# Patient Record
Sex: Female | Born: 1945
Health system: Southern US, Community
[De-identification: ages and names within clinical notes are randomized; demographics above are authoritative.]

## PROBLEM LIST (undated history)

## (undated) HISTORY — PX: ABDOMINAL HYSTERECTOMY: SHX81

---

## 1998-12-12 ENCOUNTER — Encounter: Admission: RE | Admit: 1998-12-12 | Discharge: 1998-12-12 | Payer: Self-pay

## 1999-02-15 ENCOUNTER — Other Ambulatory Visit: Admission: RE | Admit: 1999-02-15 | Discharge: 1999-02-15 | Payer: Self-pay | Admitting: Internal Medicine

## 1999-12-13 ENCOUNTER — Encounter: Admission: RE | Admit: 1999-12-13 | Discharge: 1999-12-13 | Payer: Self-pay | Admitting: *Deleted

## 2000-08-19 ENCOUNTER — Ambulatory Visit (HOSPITAL_COMMUNITY): Admission: RE | Admit: 2000-08-19 | Discharge: 2000-08-19 | Payer: Self-pay | Admitting: *Deleted

## 2000-12-13 ENCOUNTER — Encounter: Admission: RE | Admit: 2000-12-13 | Discharge: 2000-12-13 | Payer: Self-pay | Admitting: *Deleted

## 2001-12-17 ENCOUNTER — Encounter: Admission: RE | Admit: 2001-12-17 | Discharge: 2001-12-17 | Payer: Self-pay | Admitting: Internal Medicine

## 2002-12-23 ENCOUNTER — Encounter: Admission: RE | Admit: 2002-12-23 | Discharge: 2002-12-23 | Payer: Self-pay | Admitting: Internal Medicine

## 2003-12-27 ENCOUNTER — Ambulatory Visit (HOSPITAL_COMMUNITY): Admission: RE | Admit: 2003-12-27 | Discharge: 2003-12-27 | Payer: Self-pay | Admitting: Internal Medicine

## 2005-01-09 ENCOUNTER — Ambulatory Visit (HOSPITAL_COMMUNITY): Admission: RE | Admit: 2005-01-09 | Discharge: 2005-01-09 | Payer: Self-pay | Admitting: Internal Medicine

## 2005-11-23 ENCOUNTER — Ambulatory Visit (HOSPITAL_COMMUNITY): Admission: RE | Admit: 2005-11-23 | Discharge: 2005-11-23 | Payer: Self-pay | Admitting: *Deleted

## 2006-01-14 ENCOUNTER — Encounter: Admission: RE | Admit: 2006-01-14 | Discharge: 2006-01-14 | Payer: Self-pay | Admitting: Internal Medicine

## 2007-01-20 ENCOUNTER — Encounter: Admission: RE | Admit: 2007-01-20 | Discharge: 2007-01-20 | Payer: Self-pay | Admitting: Internal Medicine

## 2008-01-21 ENCOUNTER — Encounter: Admission: RE | Admit: 2008-01-21 | Discharge: 2008-01-21 | Payer: Self-pay | Admitting: Internal Medicine

## 2009-01-24 ENCOUNTER — Encounter: Admission: RE | Admit: 2009-01-24 | Discharge: 2009-01-24 | Payer: Self-pay | Admitting: Internal Medicine

## 2010-01-26 ENCOUNTER — Encounter: Admission: RE | Admit: 2010-01-26 | Discharge: 2010-01-26 | Payer: Self-pay | Admitting: Internal Medicine

## 2010-07-14 NOTE — Procedures (Signed)
[facility]  Patient:    Diane Colon, Diane Colon                         MRN: [id number] Proc. Date: 08/19/00 Attending:  Sabino Gasser, M.D.                           Procedure Report  PROCEDURE:  Colonoscopy.  INDICATIONS:  Colon polyps.  ANESTHESIA:  Demerol 60 mg, Versed 8 mg.  DESCRIPTION OF PROCEDURE:  With the patient mildly sedated in the left lateral decubitus position, the Olympus videoscopic colonoscope was inserted into the rectum and passed under direct vision to the cecum, identified by the ileocecal valve and appendiceal orifice, both of which were photographed. From this point, the colonoscope was slowly withdrawn taking circumferential views of the entire colonic mucosa, stopping in the rectum which appeared normal on direct view and retroflexed view.  The endoscope was straightened and withdrawn.  The patients vital signs and pulse oximeter remained stable. The patient tolerated the procedure well without apparent complications.  FINDINGS:  Negative colonoscopic examination to the cecum.  PLAN:  Repeat examination in approximately five years. DD:  08/19/00 TD:  08/19/00 Job: [id number]

## 2010-07-14 NOTE — Op Note (Signed)
Diane Colon, BRUNELLI                ACCOUNT NO.:  [id number]   MEDICAL RECORD NO.:  [id number]          PATIENT TYPE:  AMB   LOCATION:  ENDO                         FACILITY:  [facility]   PHYSICIAN:  Georgiana Spinner, M.D.    DATE OF BIRTH:  [birth year]   DATE OF PROCEDURE:  11/23/2005  DATE OF DISCHARGE:                                 OPERATIVE REPORT   PROCEDURE:  Colonoscopy.   INDICATIONS:  Rectal bleeding.   ANESTHESIA:  1. Fentanyl 100 mcg.  2. Versed 9 mg.   PROCEDURE:  With the patient mildly sedated in the left lateral decubitus  position, the Olympus videoscopic colonoscope was inserted into the rectum  and passed under direct vision to the cecum, identified by ileocecal valve  and appendiceal orifice, both of which were photographed. From this point,  the colonoscope was slowly withdrawn, taking circumferential views of the  colonic mucosa, stopping in the rectum which appeared normal on direct and  showed hemorrhoids on retroflexed view. The endoscope was straightened and  withdrawn.  The patient's vital signs and pulse oximeter remained stable.  The patient tolerated the procedure well, without apparent complications.   FINDINGS:  Internal hemorrhoids. Otherwise unremarkable colonoscopic  examination to the cecum.   PLAN:  Consider repeat examination in 5-10 years           ______________________________  Georgiana Spinner, M.D.     GMO/MEDQ  D:  11/23/2005  T:  11/25/2005  Job:  [id number]

## 2010-12-19 ENCOUNTER — Other Ambulatory Visit: Payer: Self-pay | Admitting: Internal Medicine

## 2010-12-19 DIAGNOSIS — Z1231 Encounter for screening mammogram for malignant neoplasm of breast: Secondary | ICD-10-CM

## 2011-01-29 ENCOUNTER — Ambulatory Visit
Admission: RE | Admit: 2011-01-29 | Discharge: 2011-01-29 | Disposition: A | Discharge status: Home / Self Care | Payer: Medicare Other | Source: Ambulatory Visit | Attending: Internal Medicine | Admitting: Internal Medicine

## 2011-01-29 DIAGNOSIS — Z1231 Encounter for screening mammogram for malignant neoplasm of breast: Secondary | ICD-10-CM

## 2011-12-21 ENCOUNTER — Other Ambulatory Visit: Payer: Self-pay | Admitting: Internal Medicine

## 2011-12-21 DIAGNOSIS — Z1231 Encounter for screening mammogram for malignant neoplasm of breast: Secondary | ICD-10-CM

## 2012-01-30 ENCOUNTER — Ambulatory Visit: Payer: Medicare Other

## 2012-02-05 ENCOUNTER — Ambulatory Visit
Admission: RE | Admit: 2012-02-05 | Discharge: 2012-02-05 | Disposition: A | Discharge status: Home / Self Care | Payer: Medicare Other | Source: Ambulatory Visit | Attending: Internal Medicine | Admitting: Internal Medicine

## 2012-02-05 DIAGNOSIS — Z1231 Encounter for screening mammogram for malignant neoplasm of breast: Secondary | ICD-10-CM

## 2012-12-29 ENCOUNTER — Other Ambulatory Visit: Payer: Self-pay

## 2012-12-29 DIAGNOSIS — Z1231 Encounter for screening mammogram for malignant neoplasm of breast: Secondary | ICD-10-CM

## 2013-02-11 ENCOUNTER — Ambulatory Visit
Admission: RE | Admit: 2013-02-11 | Discharge: 2013-02-11 | Disposition: A | Discharge status: Home / Self Care | Payer: 59 | Source: Ambulatory Visit

## 2013-02-11 DIAGNOSIS — Z1231 Encounter for screening mammogram for malignant neoplasm of breast: Secondary | ICD-10-CM

## 2013-02-18 ENCOUNTER — Other Ambulatory Visit: Payer: Self-pay | Admitting: Internal Medicine

## 2013-02-18 DIAGNOSIS — R928 Other abnormal and inconclusive findings on diagnostic imaging of breast: Secondary | ICD-10-CM

## 2013-03-02 ENCOUNTER — Ambulatory Visit
Admission: RE | Admit: 2013-03-02 | Discharge: 2013-03-02 | Disposition: A | Discharge status: Home / Self Care | Payer: Medicare Other | Source: Ambulatory Visit | Attending: Internal Medicine | Admitting: Internal Medicine

## 2013-03-02 DIAGNOSIS — R928 Other abnormal and inconclusive findings on diagnostic imaging of breast: Secondary | ICD-10-CM

## 2014-01-28 ENCOUNTER — Other Ambulatory Visit: Payer: Self-pay

## 2014-01-28 DIAGNOSIS — Z1231 Encounter for screening mammogram for malignant neoplasm of breast: Secondary | ICD-10-CM

## 2014-02-18 ENCOUNTER — Ambulatory Visit
Admission: RE | Admit: 2014-02-18 | Discharge: 2014-02-18 | Disposition: A | Discharge status: Home / Self Care | Payer: 59 | Source: Ambulatory Visit

## 2014-02-18 DIAGNOSIS — Z1231 Encounter for screening mammogram for malignant neoplasm of breast: Secondary | ICD-10-CM

## 2015-01-10 ENCOUNTER — Other Ambulatory Visit: Payer: Self-pay

## 2015-01-10 DIAGNOSIS — Z1231 Encounter for screening mammogram for malignant neoplasm of breast: Secondary | ICD-10-CM

## 2015-02-24 ENCOUNTER — Ambulatory Visit
Admission: RE | Admit: 2015-02-24 | Discharge: 2015-02-24 | Disposition: A | Discharge status: Home / Self Care | Payer: Medicare Other | Source: Ambulatory Visit

## 2015-02-24 DIAGNOSIS — Z1231 Encounter for screening mammogram for malignant neoplasm of breast: Secondary | ICD-10-CM

## 2016-01-16 ENCOUNTER — Other Ambulatory Visit: Payer: Self-pay | Admitting: Internal Medicine

## 2016-01-16 DIAGNOSIS — Z1231 Encounter for screening mammogram for malignant neoplasm of breast: Secondary | ICD-10-CM

## 2016-03-05 ENCOUNTER — Ambulatory Visit
Admission: RE | Admit: 2016-03-05 | Discharge: 2016-03-05 | Disposition: A | Discharge status: Home / Self Care | Payer: Medicare Other | Source: Ambulatory Visit | Attending: Internal Medicine | Admitting: Internal Medicine

## 2016-03-05 DIAGNOSIS — Z1231 Encounter for screening mammogram for malignant neoplasm of breast: Secondary | ICD-10-CM

## 2017-01-24 ENCOUNTER — Other Ambulatory Visit: Payer: Self-pay | Admitting: Internal Medicine

## 2017-01-24 DIAGNOSIS — Z1231 Encounter for screening mammogram for malignant neoplasm of breast: Secondary | ICD-10-CM

## 2017-03-06 ENCOUNTER — Ambulatory Visit
Admission: RE | Admit: 2017-03-06 | Discharge: 2017-03-06 | Disposition: A | Discharge status: Home / Self Care | Payer: Medicare Other | Source: Ambulatory Visit | Attending: Internal Medicine | Admitting: Internal Medicine

## 2017-03-06 DIAGNOSIS — Z1231 Encounter for screening mammogram for malignant neoplasm of breast: Secondary | ICD-10-CM

## 2018-01-21 ENCOUNTER — Other Ambulatory Visit: Payer: Self-pay | Admitting: Internal Medicine

## 2018-01-21 DIAGNOSIS — Z1231 Encounter for screening mammogram for malignant neoplasm of breast: Secondary | ICD-10-CM

## 2018-03-11 ENCOUNTER — Ambulatory Visit
Admission: RE | Admit: 2018-03-11 | Discharge: 2018-03-11 | Disposition: A | Discharge status: Home / Self Care | Payer: Medicare Other | Source: Ambulatory Visit | Attending: Internal Medicine | Admitting: Internal Medicine

## 2018-03-11 DIAGNOSIS — Z1231 Encounter for screening mammogram for malignant neoplasm of breast: Secondary | ICD-10-CM

## 2019-01-27 ENCOUNTER — Other Ambulatory Visit: Payer: Self-pay | Admitting: Internal Medicine

## 2019-01-27 DIAGNOSIS — Z1231 Encounter for screening mammogram for malignant neoplasm of breast: Secondary | ICD-10-CM

## 2019-03-19 ENCOUNTER — Other Ambulatory Visit: Payer: Self-pay

## 2019-03-19 ENCOUNTER — Ambulatory Visit
Admission: RE | Admit: 2019-03-19 | Discharge: 2019-03-19 | Disposition: A | Discharge status: Home / Self Care | Payer: TRICARE For Life (TFL) | Source: Ambulatory Visit | Attending: Internal Medicine | Admitting: Internal Medicine

## 2019-03-19 DIAGNOSIS — Z1231 Encounter for screening mammogram for malignant neoplasm of breast: Secondary | ICD-10-CM

## 2019-03-19 DIAGNOSIS — L249 Irritant contact dermatitis, unspecified cause: Secondary | ICD-10-CM | POA: Diagnosis not present

## 2019-03-19 DIAGNOSIS — L219 Seborrheic dermatitis, unspecified: Secondary | ICD-10-CM | POA: Diagnosis not present

## 2019-03-19 DIAGNOSIS — Z23 Encounter for immunization: Secondary | ICD-10-CM | POA: Diagnosis not present

## 2019-03-24 ENCOUNTER — Ambulatory Visit: Payer: Medicare Other

## 2019-04-02 ENCOUNTER — Ambulatory Visit: Payer: Medicare PPO | Attending: Internal Medicine

## 2019-04-02 DIAGNOSIS — H40013 Open angle with borderline findings, low risk, bilateral: Secondary | ICD-10-CM | POA: Diagnosis not present

## 2019-04-02 DIAGNOSIS — H02054 Trichiasis without entropian left upper eyelid: Secondary | ICD-10-CM | POA: Diagnosis not present

## 2019-04-02 DIAGNOSIS — Z23 Encounter for immunization: Secondary | ICD-10-CM | POA: Insufficient documentation

## 2019-04-02 NOTE — Progress Notes (Signed)
   Covid-19 Vaccination Clinic  Name:  Diane Colon    MRN: [id number] DOB: [birth year]  04/02/2019  [name] was observed post Covid-19 immunization for 15 minutes without incidence. She was provided with Vaccine Information Sheet and instruction to access the V-Safe system.   [name] was instructed to call 911 with any severe reactions post vaccine: Marland Kitchen Difficulty breathing  . Swelling of your face and throat  . A fast heartbeat  . A bad rash all over your body  . Dizziness and weakness    Immunizations Administered    Name Date Dose VIS Date Route   Pfizer COVID-19 Vaccine 04/02/2019 11:50 AM 0.3 mL 02/06/2019 Intramuscular   Manufacturer: [employer], [employer]   Lot: [id number]   NDC: [id number]

## 2019-04-10 ENCOUNTER — Ambulatory Visit: Payer: Medicare Other

## 2019-04-27 ENCOUNTER — Ambulatory Visit: Payer: Medicare PPO | Attending: Internal Medicine

## 2019-04-27 DIAGNOSIS — Z23 Encounter for immunization: Secondary | ICD-10-CM

## 2019-04-27 NOTE — Progress Notes (Signed)
   Covid-19 Vaccination Clinic  Name:  Diane Colon    MRN: [id number] DOB: 06-29-1945  04/27/2019  Diane Colon was observed post Covid-19 immunization for 15 minutes without incidence. She was provided with Vaccine Information Sheet and instruction to access the V-Safe system.   Diane Colon was instructed to call 911 with any severe reactions post vaccine: Marland Kitchen Difficulty breathing  . Swelling of your face and throat  . A fast heartbeat  . A bad rash all over your body  . Dizziness and weakness    Immunizations Administered    Name Date Dose VIS Date Route   Pfizer COVID-19 Vaccine 04/27/2019  3:04 PM 0.3 mL 02/06/2019 Intramuscular   Manufacturer: [employer], [employer]   Lot: [id number]   NDC: [address]-8

## 2019-06-18 DIAGNOSIS — E559 Vitamin D deficiency, unspecified: Secondary | ICD-10-CM | POA: Diagnosis not present

## 2019-06-18 DIAGNOSIS — Z0000 Encounter for general adult medical examination without abnormal findings: Secondary | ICD-10-CM | POA: Diagnosis not present

## 2019-06-18 DIAGNOSIS — R739 Hyperglycemia, unspecified: Secondary | ICD-10-CM | POA: Diagnosis not present

## 2019-06-25 DIAGNOSIS — Z0000 Encounter for general adult medical examination without abnormal findings: Secondary | ICD-10-CM | POA: Diagnosis not present

## 2019-06-25 DIAGNOSIS — R7302 Impaired glucose tolerance (oral): Secondary | ICD-10-CM | POA: Diagnosis not present

## 2019-06-25 DIAGNOSIS — E559 Vitamin D deficiency, unspecified: Secondary | ICD-10-CM | POA: Diagnosis not present

## 2019-06-25 DIAGNOSIS — M8580 Other specified disorders of bone density and structure, unspecified site: Secondary | ICD-10-CM | POA: Diagnosis not present

## 2019-09-01 DIAGNOSIS — L570 Actinic keratosis: Secondary | ICD-10-CM | POA: Diagnosis not present

## 2019-10-20 DIAGNOSIS — L089 Local infection of the skin and subcutaneous tissue, unspecified: Secondary | ICD-10-CM | POA: Diagnosis not present

## 2019-10-20 DIAGNOSIS — L0889 Other specified local infections of the skin and subcutaneous tissue: Secondary | ICD-10-CM | POA: Diagnosis not present

## 2019-10-27 DIAGNOSIS — I10 Essential (primary) hypertension: Secondary | ICD-10-CM | POA: Diagnosis not present

## 2019-11-26 DIAGNOSIS — R000 Tachycardia, unspecified: Secondary | ICD-10-CM | POA: Diagnosis not present

## 2019-11-26 DIAGNOSIS — I10 Essential (primary) hypertension: Secondary | ICD-10-CM | POA: Diagnosis not present

## 2019-12-24 DIAGNOSIS — I10 Essential (primary) hypertension: Secondary | ICD-10-CM | POA: Diagnosis not present

## 2019-12-24 DIAGNOSIS — R000 Tachycardia, unspecified: Secondary | ICD-10-CM | POA: Diagnosis not present

## 2020-01-14 DIAGNOSIS — L814 Other melanin hyperpigmentation: Secondary | ICD-10-CM | POA: Diagnosis not present

## 2020-01-14 DIAGNOSIS — Z85828 Personal history of other malignant neoplasm of skin: Secondary | ICD-10-CM | POA: Diagnosis not present

## 2020-01-14 DIAGNOSIS — D225 Melanocytic nevi of trunk: Secondary | ICD-10-CM | POA: Diagnosis not present

## 2020-01-14 DIAGNOSIS — L578 Other skin changes due to chronic exposure to nonionizing radiation: Secondary | ICD-10-CM | POA: Diagnosis not present

## 2020-01-14 DIAGNOSIS — L821 Other seborrheic keratosis: Secondary | ICD-10-CM | POA: Diagnosis not present

## 2020-01-27 DIAGNOSIS — Z79899 Other long term (current) drug therapy: Secondary | ICD-10-CM | POA: Diagnosis not present

## 2020-02-04 ENCOUNTER — Other Ambulatory Visit: Payer: Self-pay | Admitting: Internal Medicine

## 2020-02-04 DIAGNOSIS — Z1231 Encounter for screening mammogram for malignant neoplasm of breast: Secondary | ICD-10-CM

## 2020-02-05 DIAGNOSIS — I10 Essential (primary) hypertension: Secondary | ICD-10-CM | POA: Diagnosis not present

## 2020-02-05 DIAGNOSIS — R000 Tachycardia, unspecified: Secondary | ICD-10-CM | POA: Diagnosis not present

## 2020-03-01 DIAGNOSIS — H40013 Open angle with borderline findings, low risk, bilateral: Secondary | ICD-10-CM | POA: Diagnosis not present

## 2020-03-21 ENCOUNTER — Ambulatory Visit
Admission: RE | Admit: 2020-03-21 | Discharge: 2020-03-21 | Disposition: A | Discharge status: Home / Self Care | Payer: Medicare PPO | Source: Ambulatory Visit | Attending: Internal Medicine | Admitting: Internal Medicine

## 2020-03-21 ENCOUNTER — Other Ambulatory Visit: Payer: Self-pay

## 2020-03-21 DIAGNOSIS — Z1231 Encounter for screening mammogram for malignant neoplasm of breast: Secondary | ICD-10-CM

## 2020-03-22 ENCOUNTER — Other Ambulatory Visit: Payer: Self-pay | Admitting: Internal Medicine

## 2020-03-22 DIAGNOSIS — R928 Other abnormal and inconclusive findings on diagnostic imaging of breast: Secondary | ICD-10-CM

## 2020-04-05 ENCOUNTER — Other Ambulatory Visit: Payer: Self-pay

## 2020-04-05 ENCOUNTER — Ambulatory Visit
Admission: RE | Admit: 2020-04-05 | Discharge: 2020-04-05 | Disposition: A | Discharge status: Home / Self Care | Payer: Medicare PPO | Source: Ambulatory Visit | Attending: Internal Medicine | Admitting: Internal Medicine

## 2020-04-05 DIAGNOSIS — R922 Inconclusive mammogram: Secondary | ICD-10-CM | POA: Diagnosis not present

## 2020-04-05 DIAGNOSIS — R928 Other abnormal and inconclusive findings on diagnostic imaging of breast: Secondary | ICD-10-CM

## 2020-05-20 DIAGNOSIS — H01114 Allergic dermatitis of left upper eyelid: Secondary | ICD-10-CM | POA: Diagnosis not present

## 2020-05-20 DIAGNOSIS — H01115 Allergic dermatitis of left lower eyelid: Secondary | ICD-10-CM | POA: Diagnosis not present

## 2020-06-06 DIAGNOSIS — H40053 Ocular hypertension, bilateral: Secondary | ICD-10-CM | POA: Diagnosis not present

## 2020-06-06 DIAGNOSIS — H40013 Open angle with borderline findings, low risk, bilateral: Secondary | ICD-10-CM | POA: Diagnosis not present

## 2020-06-06 DIAGNOSIS — Z83511 Family history of glaucoma: Secondary | ICD-10-CM | POA: Diagnosis not present

## 2020-07-05 DIAGNOSIS — R7309 Other abnormal glucose: Secondary | ICD-10-CM | POA: Diagnosis not present

## 2020-07-05 DIAGNOSIS — Z0000 Encounter for general adult medical examination without abnormal findings: Secondary | ICD-10-CM | POA: Diagnosis not present

## 2020-07-05 DIAGNOSIS — I10 Essential (primary) hypertension: Secondary | ICD-10-CM | POA: Diagnosis not present

## 2020-07-05 DIAGNOSIS — E559 Vitamin D deficiency, unspecified: Secondary | ICD-10-CM | POA: Diagnosis not present

## 2020-07-05 DIAGNOSIS — R739 Hyperglycemia, unspecified: Secondary | ICD-10-CM | POA: Diagnosis not present

## 2020-07-11 DIAGNOSIS — R739 Hyperglycemia, unspecified: Secondary | ICD-10-CM | POA: Diagnosis not present

## 2020-07-11 DIAGNOSIS — Z0000 Encounter for general adult medical examination without abnormal findings: Secondary | ICD-10-CM | POA: Diagnosis not present

## 2020-07-11 DIAGNOSIS — E559 Vitamin D deficiency, unspecified: Secondary | ICD-10-CM | POA: Diagnosis not present

## 2020-07-11 DIAGNOSIS — I10 Essential (primary) hypertension: Secondary | ICD-10-CM | POA: Diagnosis not present

## 2020-07-11 DIAGNOSIS — R7309 Other abnormal glucose: Secondary | ICD-10-CM | POA: Diagnosis not present

## 2020-07-18 DIAGNOSIS — R7309 Other abnormal glucose: Secondary | ICD-10-CM | POA: Diagnosis not present

## 2020-07-18 DIAGNOSIS — E7800 Pure hypercholesterolemia, unspecified: Secondary | ICD-10-CM | POA: Diagnosis not present

## 2020-07-18 DIAGNOSIS — E559 Vitamin D deficiency, unspecified: Secondary | ICD-10-CM | POA: Diagnosis not present

## 2020-07-18 DIAGNOSIS — Z0000 Encounter for general adult medical examination without abnormal findings: Secondary | ICD-10-CM | POA: Diagnosis not present

## 2020-07-18 DIAGNOSIS — I10 Essential (primary) hypertension: Secondary | ICD-10-CM | POA: Diagnosis not present

## 2020-07-29 DIAGNOSIS — L249 Irritant contact dermatitis, unspecified cause: Secondary | ICD-10-CM | POA: Diagnosis not present

## 2020-09-14 DIAGNOSIS — D485 Neoplasm of uncertain behavior of skin: Secondary | ICD-10-CM | POA: Diagnosis not present

## 2020-09-14 DIAGNOSIS — L259 Unspecified contact dermatitis, unspecified cause: Secondary | ICD-10-CM | POA: Diagnosis not present

## 2020-11-02 NOTE — Progress Notes (Signed)
New Patient Note  RE: Diane Colon MRN: [id number] DOB: [birth year] Date of Office Visit: 11/03/2020  Consult requested by: Pearson Grippe, MD Primary care provider: Irena Reichmann, DO  Chief Complaint: Rash [place] dermatology about rash on face and now spread to body had biopsy that showed it was eczema)  History of Present Illness: I had the pleasure of seeing Diane Colon for initial evaluation at the [facility] on 11/03/2020. She is a 75 y.o. female, who is referred here by Irena Reichmann, DO for the evaluation of eczema.  Rash started about 5+ years ago but it's been getting worse lately. Mainly occurs on her face, neck and arms. Describes them as red and slightly itchy. Individual rashes lasts about 1 week  Associated symptoms include: none.  Frequency of episodes: depends.  Suspected triggers are unknown. Denies any fevers, chills, changes in medications, foods, personal care products or recent infections.   Currently using fragrance free/dye free items.  Switched to Vanicream which seems to be helping. Today her skin is mostly cleared up.   She has tried the following therapies: tacrolimus with good benefit.  She also tried desonide and some other creams with minimal benefit.  She may had systemic steroids in the past for this as well.  Previous work up includes: followed by dermatology who did skin biopsy showing eczema.  Previous history of rash/hives: no. Patient is up to date with the following cancer screening tests: physical exam, colonoscopy, mammogram.  Assessment and Plan: Lasheba is a 75 y.o. female with: Dermatitis Started to break out in rash 5 years ago but worsening lately.  Mainly on her face, neck and arms.  Tried desonide and tacrolimus with some benefit.  Skin biopsy showed acute spongiotic dermatitis. Concerned about allergic triggers. Today's skin testing showed: Positive to grass, trees, weed, cat.  Negative to common foods. Discussed with  patient that I'm not sure how much of the above allergens are contributing to her symptoms but distribution is more concerning for contact dermatitis.  Recommend patch testing next - this is done in our [place] office.  If there's something you use regularly on the face, bring that product in as well so we can patch test the product. See below for proper skin care. Use fragrance free and dye free products. No fabric softener or dryer sheets. Use Eucrisa (crisaborole) 2% ointment twice a day on mild rash flares on the face and body. This is a non-steroid ointment. Samples given.  May use tacrolimus (protopic) 0.1% ointment twice a day as needed for the rash. Don't use more than 7 days in a row.  Allergic contact dermatitis See assessment and plan as above.  Other allergic rhinitis Some rhinoconjunctivitis symptoms since stopped Benadryl for skin testing this week.  No prior allergy testing. 1 cat at home. Today's skin testing showed: Positive to grass, trees, weed, cat.  Start environmental control measures as below. Use over the counter antihistamines such as Zyrtec (cetirizine), Claritin (loratadine), Allegra (fexofenadine), or Xyzal (levocetirizine) daily as needed. May take twice a day during allergy flares. May switch antihistamines every few months.  Return for Patch testing.  Meds ordered this encounter  Medications   Crisaborole (EUCRISA) 2 % OINT    Sig: Apply 1 application topically 2 (two) times daily as needed (mild rash).    Dispense:  60 g    Refill:  5    Lab Orders  No laboratory test(s) ordered today    Other  allergy screening: Asthma: no Rhino conjunctivitis:  some symptoms this week since stopped benadryl. No prior allergy testing.  Food allergy: no Medication allergy: yes Hymenoptera allergy: no History of recurrent infections suggestive of immunodeficency: no  Diagnostics: Skin Testing: Environmental allergy panel and select foods. Positive to grass,  trees, weed, cat.  Negative to common foods. Results discussed with patient/family.  Airborne Adult Perc - 11/03/20 0904     Time Antigen Placed 0900    Allergen Manufacturer Waynette Buttery    Location Back    Number of Test 59    1. Control-Buffer 50% Glycerol Negative    2. Control-Histamine 1 mg/ml 2+    3. Albumin saline Negative    4. Bahia Negative    5. French Southern Territories Negative    6. Johnson Negative    7. Kentucky Blue Negative    8. Meadow Fescue 2+    9. Perennial Rye Negative    10. Sweet Vernal Negative    11. Timothy Negative    12. Cocklebur Negative    13. Burweed Marshelder Negative    14. Ragweed, short Negative    15. Ragweed, Giant Negative    16. Plantain,  English Negative    17. Lamb's Quarters Negative    18. Sheep Sorrell Negative    19. Rough Pigweed Negative    20. Marsh Elder, Rough Negative    21. Mugwort, Common Negative    22. Ash mix 4+    23. Birch mix 2+    24. Beech American 4+    25. Box, Elder --   +/-   26. Cedar, red Negative    27. Cottonwood, Guinea-Bissau --   +/-   28. Elm mix Negative    29. Hickory 2+    30. Maple mix Negative    31. Oak, Guinea-Bissau mix 3+    32. Pecan Pollen Negative    33. Pine mix Negative    34. Sycamore Eastern Negative    35. Walnut, Black Pollen Negative    36. Alternaria alternata Negative    37. Cladosporium Herbarum Negative    38. Aspergillus mix Negative    39. Penicillium mix Negative    40. Bipolaris sorokiniana (Helminthosporium) Negative    41. Drechslera spicifera (Curvularia) Negative    42. Mucor plumbeus Negative    43. Fusarium moniliforme Negative    44. Aureobasidium pullulans (pullulara) Negative    45. Rhizopus oryzae Negative    46. Botrytis cinera Negative    47. Epicoccum nigrum Negative    48. Phoma betae Negative    49. Candida Albicans Negative    50. Trichophyton mentagrophytes Negative    51. Mite, D Farinae  5,000 AU/ml Negative    52. Mite, D Pteronyssinus  5,000 AU/ml Negative    53.  Cat Hair 10,000 BAU/ml Negative    54.  Dog Epithelia Negative    55. Mixed Feathers Negative    56. Horse Epithelia Negative    57. Cockroach, German Negative    58. Mouse Negative    59. Tobacco Leaf Negative             Food Perc - 11/03/20 0904       Test Information   Time Antigen Placed 0900    Allergen Manufacturer Waynette Buttery    Location Back    Number of allergen test 10      Food   1. Peanut Negative    2. Soybean food Negative    3. Wheat, whole  Negative    4. Sesame Negative    5. Milk, cow Negative    6. Egg White, chicken Negative    7. Casein Negative    8. Shellfish mix Negative    9. Fish mix Negative    10. Cashew Negative             Intradermal - 11/03/20 0930     Time Antigen Placed 0930    Allergen Manufacturer Waynette ButteryGreer    Location Arm    Number of Test 13    Control Negative    French Southern TerritoriesBermuda Negative    Johnson Negative    Ragweed mix Negative    Weed mix 3+    Mold 1 Negative    Mold 2 Negative    Mold 3 Negative    Mold 4 Negative    Cat 3+    Dog Negative    Cockroach Negative    Mite mix Negative    Comments n             Past Medical History: Patient Active Problem List   Diagnosis Date Noted   Dermatitis 11/03/2020   Allergic contact dermatitis 11/03/2020   Other allergic rhinitis 11/03/2020    History reviewed. No pertinent past medical history. Past Surgical History: Past Surgical History:  Procedure Laterality Date   ABDOMINAL HYSTERECTOMY     Medication List:  Current Outpatient Medications  Medication Sig Dispense Refill   Ascorbic Acid (VITAMIN C) 500 MG CAPS Take 500 ng by mouth daily as needed.     Chromium Picolinate 400 MCG TABS Take 400 mcg by mouth See admin instructions.     Crisaborole (EUCRISA) 2 % OINT Apply 1 application topically 2 (two) times daily as needed (mild rash). 60 g 5   hydrochlorothiazide (HYDRODIURIL) 25 MG tablet Take 25 mg by mouth daily.     losartan (COZAAR) 100 MG tablet Take 1  tablet by mouth daily.     Magnesium 300 MG CAPS 1 capsule with a meal     tacrolimus (PROTOPIC) 0.1 % ointment 1 a small amount to affected area twice a day     No current facility-administered medications for this visit.   Allergies: Allergies  Allergen Reactions   Erythromycin Diarrhea, Other (See Comments) and Nausea Only   Latex Hives, Itching and Swelling   Penicillin G Hives   Sulfur Hives, Itching and Rash   Social History: Social History   Socioeconomic History   Marital status: Married    Spouse name: Not on file   Number of children: Not on file   Years of education: Not on file   Highest education level: Not on file  Occupational History   Not on file  Tobacco Use   Smoking status: Never    Passive exposure: Never   Smokeless tobacco: Never  Vaping Use   Vaping Use: Never used  Substance and Sexual Activity   Alcohol use: Yes   Drug use: Never   Sexual activity: Not on file  Other Topics Concern   Not on file  Social History Narrative   Not on file   Social Determinants of Health   Financial Resource Strain: Not on file  Food Insecurity: Not on file  Transportation Needs: Not on file  Physical Activity: Not on file  Stress: Not on file  Social Connections: Not on file   Lives in a 77 year old house. Smoking: denies Occupation: retired  Landscape architectnvironmental HistorySurveyor, minerals: Water Damage/mildew in the house: no  Carpet in the family room: no Carpet in the bedroom: no Heating: gas Cooling: central Pet: yes 1 cat x 12 yrs  Family History: Family History  Problem Relation Age of Onset   Breast cancer Mother    Breast cancer Paternal Aunt    Breast cancer Paternal Aunt    Allergic rhinitis Neg Hx    Angioedema Neg Hx    Asthma Neg Hx    Atopy Neg Hx    Eczema Neg Hx    Immunodeficiency Neg Hx    Urticaria Neg Hx    Review of Systems  Constitutional:  Negative for appetite change, chills, fever and unexpected weight change.  HENT:  Positive for  rhinorrhea. Negative for congestion.   Eyes:  Positive for itching.  Respiratory:  Negative for cough, chest tightness, shortness of breath and wheezing.   Cardiovascular:  Negative for chest pain.  Gastrointestinal:  Negative for abdominal pain.  Genitourinary:  Negative for difficulty urinating.  Skin:  Positive for rash.  Allergic/Immunologic: Positive for environmental allergies. Negative for food allergies.  Neurological:  Negative for headaches.   Objective: BP 122/76   Pulse 92   Temp 98.5 F (36.9 C) (Temporal)   Resp 16   Ht 5\' 2"  (1.575 m)   Wt 116 lb 6.4 oz (52.8 kg)   SpO2 98%   BMI 21.29 kg/m  Body mass index is 21.29 kg/m. Physical Exam Vitals and nursing note reviewed.  Constitutional:      Appearance: Normal appearance. She is well-developed.  HENT:     Head: Normocephalic and atraumatic.     Right Ear: Tympanic membrane and external ear normal.     Left Ear: Tympanic membrane and external ear normal.     Nose: Nose normal.     Mouth/Throat:     Mouth: Mucous membranes are moist.     Pharynx: Oropharynx is clear.  Eyes:     Conjunctiva/sclera: Conjunctivae normal.  Cardiovascular:     Rate and Rhythm: Normal rate and regular rhythm.     Heart sounds: Normal heart sounds. No murmur heard.   No friction rub. No gallop.  Pulmonary:     Effort: Pulmonary effort is normal.     Breath sounds: Normal breath sounds. No wheezing, rhonchi or rales.  Musculoskeletal:     Cervical back: Neck supple.  Skin:    General: Skin is warm.     Findings: Rash present.     Comments: Slightly erythematous patch on left neck area.  Neurological:     Mental Status: She is alert and oriented to person, place, and time.  Psychiatric:        Behavior: Behavior normal.  The plan was reviewed with the patient/family, and all questions/concerned were addressed.  It was my pleasure to see Diane Colon today and participate in her care. Please feel free to contact me with any  questions or concerns.  Sincerely,  Bonita Quin, DO Allergy & Immunology  [facility] office: [phone] [facility] office: [phone]

## 2020-11-03 ENCOUNTER — Encounter: Payer: Self-pay | Admitting: Allergy

## 2020-11-03 ENCOUNTER — Other Ambulatory Visit: Payer: Self-pay

## 2020-11-03 ENCOUNTER — Ambulatory Visit (INDEPENDENT_AMBULATORY_CARE_PROVIDER_SITE_OTHER): Payer: Medicare PPO | Admitting: Allergy

## 2020-11-03 VITALS — BP 122/76 | HR 92 | Temp 98.5°F | Resp 16 | Ht 62.0 in | Wt 116.4 lb

## 2020-11-03 DIAGNOSIS — L2089 Other atopic dermatitis: Secondary | ICD-10-CM

## 2020-11-03 DIAGNOSIS — L239 Allergic contact dermatitis, unspecified cause: Secondary | ICD-10-CM | POA: Insufficient documentation

## 2020-11-03 DIAGNOSIS — J3089 Other allergic rhinitis: Secondary | ICD-10-CM

## 2020-11-03 MED ORDER — EUCRISA 2 % EX OINT: 1.0000 "application " | TOPICAL_OINTMENT | Freq: Two times a day (BID) | CUTANEOUS | 5 refills | Status: DC | PRN

## 2020-11-03 MED ORDER — EUCRISA 2 % EX OINT: 1.0000 "application " | TOPICAL_OINTMENT | Freq: Two times a day (BID) | CUTANEOUS | 5 refills | Status: AC | PRN

## 2020-11-03 NOTE — Assessment & Plan Note (Signed)
.   See assessment and plan as above. 

## 2020-11-03 NOTE — Assessment & Plan Note (Signed)
Started to break out in rash 5 years ago but worsening lately.  Mainly on her face, neck and arms.  Tried desonide and tacrolimus with some benefit.  Skin biopsy showed acute spongiotic dermatitis. Concerned about allergic triggers.  Today's skin testing showed: Positive to grass, trees, weed, cat.  Negative to common foods.  Discussed with patient that I'm not sure how much of the above allergens are contributing to her symptoms but distribution is more concerning for contact dermatitis.  . Recommend patch testing next - this is done in our [place] office.  o If there's something you use regularly on the face, bring that product in as well so we can patch test the product. . See below for proper skin care. o Use fragrance free and dye free products. o No fabric softener or dryer sheets.  Use Eucrisa (crisaborole) 2% ointment twice a day on mild rash flares on the face and body. This is a non-steroid ointment. Samples given.   May use tacrolimus (protopic) 0.1% ointment twice a day as needed for the rash. Don't use more than 7 days in a row.

## 2020-11-03 NOTE — Assessment & Plan Note (Addendum)
Some rhinoconjunctivitis symptoms since stopped Benadryl for skin testing this week.  No prior allergy testing. 1 cat at home.  Today's skin testing showed: Positive to grass, trees, weed, cat.   Start environmental control measures as below.  Use over the counter antihistamines such as Zyrtec (cetirizine), Claritin (loratadine), Allegra (fexofenadine), or Xyzal (levocetirizine) daily as needed. May take twice a day during allergy flares. May switch antihistamines every few months.

## 2020-11-03 NOTE — Patient Instructions (Signed)
Today's skin testing showed: Positive to grass, trees, weed, cat.  Negative to common foods. Results given.   Rash: Distribution concerning for contact dermatitis. See below for proper skin care. Use fragrance free and dye free products. No fabric softener or dryer sheets.  Use Eucrisa (crisaborole) 2% ointment twice a day on mild rash flares on the face and body. This is a non-steroid ointment. Samples given.  If it burns, place the medication in the refrigerator.  Apply a thin layer of moisturizer and then apply the Eucrisa on top of it. May use tacrolimus (protopic) 0.1% ointment twice a day as needed for the rash. Don't use more than 7 days in a row.  Recommend patch testing next - this is done in our [place] office.  If there's something you use regularly on the face, bring that product in as well so we can patch test the product. Patches are best placed on Monday with return to office on Wednesday and Friday of same week for readings.  Patches once placed should not get wet.  You do not have to stop any medications for patch testing but should not be on oral prednisone. You can schedule a patch testing visit when convenient for your schedule.    Environmental allergies Start environmental control measures as below. Use over the counter antihistamines such as Zyrtec (cetirizine), Claritin (loratadine), Allegra (fexofenadine), or Xyzal (levocetirizine) daily as needed. May take twice a day during allergy flares. May switch antihistamines every few months.  Follow up for patch testing in the [place] office.   Skin care recommendations  Bath time: Always use lukewarm water. AVOID very hot or cold water. Keep bathing time to 5-10 minutes. Do NOT use bubble bath. Use a mild soap and use just enough to wash the dirty areas. Do NOT scrub skin vigorously.  After bathing, pat dry your skin with a towel. Do NOT rub or scrub the skin.  Moisturizers and prescriptions:  ALWAYS  apply moisturizers immediately after bathing (within 3 minutes). This helps to lock-in moisture. Use the moisturizer several times a day over the whole body. Good summer moisturizers include: Aveeno, CeraVe, Cetaphil. Good winter moisturizers include: Aquaphor, Vaseline, Cerave, Cetaphil, Eucerin, Vanicream. When using moisturizers along with medications, the moisturizer should be applied about one hour after applying the medication to prevent diluting effect of the medication or moisturize around where you applied the medications. When not using medications, the moisturizer can be continued twice daily as maintenance.  Laundry and clothing: Avoid laundry products with added color or perfumes. Use unscented hypo-allergenic laundry products such as Tide free, Cheer free & gentle, and All free and clear.  If the skin still seems dry or sensitive, you can try double-rinsing the clothes. Avoid tight or scratchy clothing such as wool. Do not use fabric softeners or dyer sheets.   Reducing Pollen Exposure Pollen seasons: trees (spring), grass (summer) and ragweed/weeds (fall). Keep windows closed in your home and car to lower pollen exposure.  Install air conditioning in the bedroom and throughout the house if possible.  Avoid going out in dry windy days - especially early morning. Pollen counts are highest between 5 - 10 AM and on dry, hot and windy days.  Save outside activities for late afternoon or after a heavy rain, when pollen levels are lower.  Avoid mowing of grass if you have grass pollen allergy. Be aware that pollen can also be transported indoors on people and pets.  Dry your clothes in an automatic  dryer rather than hanging them outside where they might collect pollen.  Rinse hair and eyes before bedtime.  Pet Allergen Avoidance: Contrary to popular opinion, there are no "hypoallergenic" breeds of dogs or cats. That is because people are not allergic to an animal's hair, but to an  allergen found in the animal's saliva, dander (dead skin flakes) or urine. Pet allergy symptoms typically occur within minutes. For some people, symptoms can build up and become most severe 8 to 12 hours after contact with the animal. People with severe allergies can experience reactions in public places if dander has been transported on the pet owners' clothing. Keeping an animal outdoors is only a partial solution, since homes with pets in the yard still have higher concentrations of animal allergens. Before getting a pet, ask your allergist to determine if you are allergic to animals. If your pet is already considered part of your family, try to minimize contact and keep the pet out of the bedroom and other rooms where you spend a great deal of time. As with dust mites, vacuum carpets often or replace carpet with a hardwood floor, tile or linoleum. High-efficiency particulate air (HEPA) cleaners can reduce allergen levels over time. While dander and saliva are the source of cat and dog allergens, urine is the source of allergens from rabbits, hamsters, mice and [place] pigs; so ask a non-allergic family member to clean the animal's cage. If you have a pet allergy, talk to your allergist about the potential for allergy immunotherapy (allergy shots). This strategy can often provide long-term relief.

## 2020-11-07 ENCOUNTER — Telehealth: Payer: Self-pay | Admitting: Allergy

## 2020-11-07 NOTE — Telephone Encounter (Signed)
[employer] [facility] Patient Assistance called to check on a prior authorization for [place].  Phone #: [phone] Fax #: [phone]

## 2020-11-08 NOTE — Telephone Encounter (Signed)
Submitted prior authorization for Eucrisa  Ref# [id number]  Should receive a fax within 72 hours

## 2020-11-14 ENCOUNTER — Encounter: Payer: Self-pay | Admitting: Allergy

## 2020-11-14 ENCOUNTER — Other Ambulatory Visit: Payer: Self-pay

## 2020-11-14 ENCOUNTER — Ambulatory Visit (INDEPENDENT_AMBULATORY_CARE_PROVIDER_SITE_OTHER): Payer: Medicare PPO | Admitting: Allergy

## 2020-11-14 VITALS — BP 124/74 | HR 90 | Temp 98.2°F | Resp 18 | Ht 62.0 in | Wt 118.0 lb

## 2020-11-14 DIAGNOSIS — H1010 Acute atopic conjunctivitis, unspecified eye: Secondary | ICD-10-CM | POA: Insufficient documentation

## 2020-11-14 DIAGNOSIS — L239 Allergic contact dermatitis, unspecified cause: Secondary | ICD-10-CM

## 2020-11-14 DIAGNOSIS — J302 Other seasonal allergic rhinitis: Secondary | ICD-10-CM | POA: Insufficient documentation

## 2020-11-14 NOTE — Progress Notes (Signed)
   Follow Up Note  RE: Diane Colon MRN: [id number] DOB: [birth year] Date of Office Visit: 11/14/2020  Referring provider: Irena Reichmann, DO Primary care provider: Irena Reichmann, DO  History of Present Illness: I had the pleasure of seeing Diane Colon for a follow up visit at the [facility] on 11/14/2020. She is a 75 y.o. female, who is being followed for dermatitis and allergic rhino conjunctivitis. Today she is here for patch test placement, given suspected history of contact dermatitis.   Dermatitis Still having some rash on the and neck but it did not flare as before.  Just got [place] approved. Currently using Eucrisa only as needed. Not used tacrolimus since the last visit.  Other creams she tried: Desonide 0.05% Ketoconazole 2% Triamcinolone 0.1% Mupirocin 2% Tacrolimus 0.1%  Brought in some products and eye drops she has been using to be patch tested for.    #1: La Roche wash #2: La Roche Eczema cream #3: La Roche moisturizer  #4: Refresh contacts #5: Refresh gel drops #6: Systane ultra #7: Zaditor #8: Contact lens  Diagnostics: TRUE Test patches and the above 8 items were placed.   Assessment and Plan: Diane Colon is a 75 y.o. female with: Allergic contact dermatitis Past history - Started to break out in rash 5 years ago but worsening lately.  Mainly on her face, neck and arms.  Tried desonide and tacrolimus with some benefit.  Tried: ketoconazole 2%, triamcinolone 0.1%, mupirocin 2%. Skin biopsy showed acute spongiotic dermatitis. 2022 skin testing showed: Positive to grass, trees, weed, cat.  Negative to common foods. Interim history - not as bad as before, brought items to be patch tested for.  Patches placed today - TRUE and 8 additional items provided by the patient. #1: La Roche wash #2: La Roche Eczema cream #3: La Roche moisturizer  #4: Refresh contacts #5: Refresh gel drops #6: Systane ultra #7: Zaditor #8: Contact lens Continue  proper skin care. Use fragrance free and dye free products. No fabric softener or dryer sheets. Use Eucrisa (crisaborole) 2% ointment twice a day on mild rash flares on the face and body. This is a non-steroid ointment.   Seasonal and perennial allergic rhinoconjunctivitis Past history - Some rhinoconjunctivitis symptoms since stopped Benadryl for skin testing this week.  1 cat at home. 2022 skin testing showed: Positive to grass, trees, weed, cat.  Continue environmental control measures as below. Use over the counter antihistamines such as Zyrtec (cetirizine), Claritin (loratadine), Allegra (fexofenadine), or Xyzal (levocetirizine) daily as needed. May take twice a day during allergy flares. May switch antihistamines every few months.  The patient was instructed regarding proper care of the patches for the next 48 hours. Do not get patches wet - avoid showering until the next visit. Do not engage in vigorous physical activity. Patient will follow up in 48 hours and 96 hours for patch readings.  It was my pleasure to see Diane Colon today and participate in her care. Please feel free to contact me with any questions or concerns.  Sincerely,  Wyline Mood, DO Allergy & Immunology  [facility] office: [phone] [facility] office: [phone] [place] office: [phone]

## 2020-11-14 NOTE — Patient Instructions (Addendum)
Rash: Patches placed today. Continue proper skin care. Use fragrance free and dye free products. No fabric softener or dryer sheets. Use Eucrisa (crisaborole) 2% ointment twice a day on mild rash flares on the face and body. This is a non-steroid ointment. Samples given.  If it burns, place the medication in the refrigerator.  Apply a thin layer of moisturizer and then apply the Eucrisa on top of it.  Environmental allergies 2022 skin testing showed: Positive to grass, trees, weed, cat.  Continue environmental control measures as below. Use over the counter antihistamines such as Zyrtec (cetirizine), Claritin (loratadine), Allegra (fexofenadine), or Xyzal (levocetirizine) daily as needed. May take twice a day during allergy flares. May switch antihistamines every few months.  Follow up for patch reading on Wednesday and Friday.   Skin care recommendations  Bath time: Always use lukewarm water. AVOID very hot or cold water. Keep bathing time to 5-10 minutes. Do NOT use bubble bath. Use a mild soap and use just enough to wash the dirty areas. Do NOT scrub skin vigorously.  After bathing, pat dry your skin with a towel. Do NOT rub or scrub the skin.  Moisturizers and prescriptions:  ALWAYS apply moisturizers immediately after bathing (within 3 minutes). This helps to lock-in moisture. Use the moisturizer several times a day over the whole body. Good summer moisturizers include: Aveeno, CeraVe, Cetaphil. Good winter moisturizers include: Aquaphor, Vaseline, Cerave, Cetaphil, Eucerin, Vanicream. When using moisturizers along with medications, the moisturizer should be applied about one hour after applying the medication to prevent diluting effect of the medication or moisturize around where you applied the medications. When not using medications, the moisturizer can be continued twice daily as maintenance.  Laundry and clothing: Avoid laundry products with added color or perfumes. Use  unscented hypo-allergenic laundry products such as Tide free, Cheer free & gentle, and All free and clear.  If the skin still seems dry or sensitive, you can try double-rinsing the clothes. Avoid tight or scratchy clothing such as wool. Do not use fabric softeners or dyer sheets.   Reducing Pollen Exposure Pollen seasons: trees (spring), grass (summer) and ragweed/weeds (fall). Keep windows closed in your home and car to lower pollen exposure.  Install air conditioning in the bedroom and throughout the house if possible.  Avoid going out in dry windy days - especially early morning. Pollen counts are highest between 5 - 10 AM and on dry, hot and windy days.  Save outside activities for late afternoon or after a heavy rain, when pollen levels are lower.  Avoid mowing of grass if you have grass pollen allergy. Be aware that pollen can also be transported indoors on people and pets.  Dry your clothes in an automatic dryer rather than hanging them outside where they might collect pollen.  Rinse hair and eyes before bedtime.  Pet Allergen Avoidance: Contrary to popular opinion, there are no "hypoallergenic" breeds of dogs or cats. That is because people are not allergic to an animal's hair, but to an allergen found in the animal's saliva, dander (dead skin flakes) or urine. Pet allergy symptoms typically occur within minutes. For some people, symptoms can build up and become most severe 8 to 12 hours after contact with the animal. People with severe allergies can experience reactions in public places if dander has been transported on the pet owners' clothing. Keeping an animal outdoors is only a partial solution, since homes with pets in the yard still have higher concentrations of animal allergens. Before  getting a pet, ask your allergist to determine if you are allergic to animals. If your pet is already considered part of your family, try to minimize contact and keep the pet out of the bedroom and  other rooms where you spend a great deal of time. As with dust mites, vacuum carpets often or replace carpet with a hardwood floor, tile or linoleum. High-efficiency particulate air (HEPA) cleaners can reduce allergen levels over time. While dander and saliva are the source of cat and dog allergens, urine is the source of allergens from rabbits, hamsters, mice and [place] pigs; so ask a non-allergic family member to clean the animal's cage. If you have a pet allergy, talk to your allergist about the potential for allergy immunotherapy (allergy shots). This strategy can often provide long-term relief.

## 2020-11-14 NOTE — Assessment & Plan Note (Signed)
Past history - Some rhinoconjunctivitis symptoms since stopped Benadryl for skin testing this week.  1 cat at home. 2022 skin testing showed: Positive to grass, trees, weed, cat.   Continue environmental control measures as below.  Use over the counter antihistamines such as Zyrtec (cetirizine), Claritin (loratadine), Allegra (fexofenadine), or Xyzal (levocetirizine) daily as needed. May take twice a day during allergy flares. May switch antihistamines every few months.

## 2020-11-14 NOTE — Assessment & Plan Note (Signed)
Past history - Started to break out in rash 5 years ago but worsening lately.  Mainly on her face, neck and arms.  Tried desonide and tacrolimus with some benefit.  Tried: ketoconazole 2%, triamcinolone 0.1%, mupirocin 2%. Skin biopsy showed acute spongiotic dermatitis. 2022 skin testing showed: Positive to grass, trees, weed, cat.  Negative to common foods. Interim history - not as bad as before, brought items to be patch tested for.  . Patches placed today - TRUE and 8 additional items provided by the patient.  #1: La Roche wash  #2: La Roche Eczema cream  #3: La Roche moisturizer   #4: Refresh contacts  #5: Refresh gel drops  #6: Systane ultra  #7: Zaditor  #8: Contact lens . Continue proper skin care. o Use fragrance free and dye free products. o No fabric softener or dryer sheets.  Use Eucrisa (crisaborole) 2% ointment twice a day on mild rash flares on the face and body. This is a non-steroid ointment.

## 2020-11-16 ENCOUNTER — Other Ambulatory Visit: Payer: Self-pay

## 2020-11-16 ENCOUNTER — Ambulatory Visit (INDEPENDENT_AMBULATORY_CARE_PROVIDER_SITE_OTHER): Payer: Medicare PPO | Admitting: Allergy

## 2020-11-16 ENCOUNTER — Encounter: Payer: TRICARE For Life (TFL) | Admitting: Allergy

## 2020-11-16 ENCOUNTER — Encounter: Payer: Self-pay | Admitting: Allergy

## 2020-11-16 VITALS — Temp 97.5°F

## 2020-11-16 DIAGNOSIS — L2389 Allergic contact dermatitis due to other agents: Secondary | ICD-10-CM

## 2020-11-16 NOTE — Progress Notes (Signed)
    Follow-up Note  RE: Diane Colon MRN: [id number] DOB: [birth year] Date of Office Visit: 11/16/2020  Primary care provider: Irena Reichmann, DO Referring provider: Irena Reichmann, DO   [name] returns to the office today for the initial patch test interpretation, given suspected history of contact dermatitis.    Diagnostics:  TRUE TEST 48 hour reading: 1+ nickel sulfate; 2+ Cl+Me-Isothiazolinone; 1+ gold sodium thiosulfate  Personal products 48 hour reading: 1+ Lipikar wash  Plan:  Allergic contact dermatitis The patient has been provided detailed information regarding the substances she is sensitive to, as well as products containing the substances.  Meticulous avoidance of these substances is recommended. If avoidance is not possible, the use of barrier creams or lotions is recommended. She will return in 2 days for final reading Discussed will provide with product safe list via email after final reading  Margo Aye, MD [facility]

## 2020-11-18 ENCOUNTER — Encounter: Payer: Self-pay | Admitting: Allergy

## 2020-11-18 ENCOUNTER — Ambulatory Visit (INDEPENDENT_AMBULATORY_CARE_PROVIDER_SITE_OTHER): Payer: Medicare PPO | Admitting: Allergy

## 2020-11-18 ENCOUNTER — Other Ambulatory Visit: Payer: Self-pay

## 2020-11-18 DIAGNOSIS — L2389 Allergic contact dermatitis due to other agents: Secondary | ICD-10-CM | POA: Diagnosis not present

## 2020-11-18 NOTE — Progress Notes (Signed)
    Follow-up Note  RE: Diane Colon MRN: [id number] DOB: 1945-12-27 Date of Office Visit: 11/18/2020  Primary care provider: Irena Reichmann, DO Referring provider: Irena Reichmann, DO   [name] returns to the office today for the final patch test interpretation, given suspected history of contact dermatitis.    Diagnostics:  TRUE TEST 96 hour reading: 1+ fragrance mix; 3+ Cl+Me-Isothiazolinone   Personal products 96 hour reading: 1+ to #1 double repair face moisturizer; 2+ to lipikar wash.  Plan:  Allergic contact dermatitis The patient has been provided detailed information regarding the substances she is sensitive to, as well as products containing the substances.  Meticulous avoidance of these substances is recommended. If avoidance is not possible, the use of barrier creams or lotions is recommended. The product safe list from the contact dermatitis Society has been emailed to [email or web address]  Margo Aye, MD [facility]

## 2020-12-16 ENCOUNTER — Encounter: Payer: Self-pay | Admitting: *Deleted

## 2021-01-17 DIAGNOSIS — E7800 Pure hypercholesterolemia, unspecified: Secondary | ICD-10-CM | POA: Diagnosis not present

## 2021-01-17 DIAGNOSIS — L814 Other melanin hyperpigmentation: Secondary | ICD-10-CM | POA: Diagnosis not present

## 2021-01-17 DIAGNOSIS — R7309 Other abnormal glucose: Secondary | ICD-10-CM | POA: Diagnosis not present

## 2021-01-17 DIAGNOSIS — D1801 Hemangioma of skin and subcutaneous tissue: Secondary | ICD-10-CM | POA: Diagnosis not present

## 2021-01-17 DIAGNOSIS — E559 Vitamin D deficiency, unspecified: Secondary | ICD-10-CM | POA: Diagnosis not present

## 2021-01-17 DIAGNOSIS — L821 Other seborrheic keratosis: Secondary | ICD-10-CM | POA: Diagnosis not present

## 2021-01-23 DIAGNOSIS — H40013 Open angle with borderline findings, low risk, bilateral: Secondary | ICD-10-CM | POA: Diagnosis not present

## 2021-01-24 DIAGNOSIS — R7309 Other abnormal glucose: Secondary | ICD-10-CM | POA: Diagnosis not present

## 2021-01-24 DIAGNOSIS — E559 Vitamin D deficiency, unspecified: Secondary | ICD-10-CM | POA: Diagnosis not present

## 2021-01-24 DIAGNOSIS — I10 Essential (primary) hypertension: Secondary | ICD-10-CM | POA: Diagnosis not present

## 2021-01-24 DIAGNOSIS — R7989 Other specified abnormal findings of blood chemistry: Secondary | ICD-10-CM | POA: Diagnosis not present

## 2021-01-24 DIAGNOSIS — E7800 Pure hypercholesterolemia, unspecified: Secondary | ICD-10-CM | POA: Diagnosis not present

## 2021-01-25 ENCOUNTER — Telehealth: Payer: Self-pay

## 2021-01-25 NOTE — Telephone Encounter (Signed)
[employer] [facility] called due to the patient's prior authorization expiring 02/24/21. The pharmacist started the process for a new prior authorization and will fax over the information to the [place] office.

## 2021-01-26 NOTE — Telephone Encounter (Signed)
PA has been submitted through CoverMyMeds for Eucrisa and is currently pending approval/denial.  

## 2021-01-26 NOTE — Telephone Encounter (Signed)
Eucrisa 2% ointment has been approved until 02/25/2022. Approval letter has been faxed to [facility].

## 2021-03-01 ENCOUNTER — Other Ambulatory Visit: Payer: Self-pay | Admitting: Family Medicine

## 2021-03-01 DIAGNOSIS — Z1231 Encounter for screening mammogram for malignant neoplasm of breast: Secondary | ICD-10-CM

## 2021-03-10 DIAGNOSIS — H18413 Arcus senilis, bilateral: Secondary | ICD-10-CM | POA: Diagnosis not present

## 2021-03-10 DIAGNOSIS — H25043 Posterior subcapsular polar age-related cataract, bilateral: Secondary | ICD-10-CM | POA: Diagnosis not present

## 2021-03-10 DIAGNOSIS — H25013 Cortical age-related cataract, bilateral: Secondary | ICD-10-CM | POA: Diagnosis not present

## 2021-03-10 DIAGNOSIS — H2511 Age-related nuclear cataract, right eye: Secondary | ICD-10-CM | POA: Diagnosis not present

## 2021-03-10 DIAGNOSIS — H2513 Age-related nuclear cataract, bilateral: Secondary | ICD-10-CM | POA: Diagnosis not present

## 2021-03-22 ENCOUNTER — Ambulatory Visit
Admission: RE | Admit: 2021-03-22 | Discharge: 2021-03-22 | Disposition: A | Discharge status: Home / Self Care | Payer: Medicare PPO | Source: Ambulatory Visit | Attending: Family Medicine | Admitting: Family Medicine

## 2021-03-22 DIAGNOSIS — Z1231 Encounter for screening mammogram for malignant neoplasm of breast: Secondary | ICD-10-CM

## 2021-03-24 ENCOUNTER — Other Ambulatory Visit: Payer: Self-pay | Admitting: Family Medicine

## 2021-03-24 DIAGNOSIS — R928 Other abnormal and inconclusive findings on diagnostic imaging of breast: Secondary | ICD-10-CM

## 2021-04-06 ENCOUNTER — Other Ambulatory Visit: Payer: Self-pay

## 2021-04-06 ENCOUNTER — Ambulatory Visit
Admission: RE | Admit: 2021-04-06 | Discharge: 2021-04-06 | Disposition: A | Discharge status: Home / Self Care | Payer: Medicare PPO | Source: Ambulatory Visit | Attending: Family Medicine | Admitting: Family Medicine

## 2021-04-06 ENCOUNTER — Other Ambulatory Visit: Payer: Self-pay | Admitting: Family Medicine

## 2021-04-06 DIAGNOSIS — R922 Inconclusive mammogram: Secondary | ICD-10-CM | POA: Diagnosis not present

## 2021-04-06 DIAGNOSIS — R928 Other abnormal and inconclusive findings on diagnostic imaging of breast: Secondary | ICD-10-CM

## 2021-04-06 DIAGNOSIS — R921 Mammographic calcification found on diagnostic imaging of breast: Secondary | ICD-10-CM

## 2021-05-05 DIAGNOSIS — I10 Essential (primary) hypertension: Secondary | ICD-10-CM | POA: Diagnosis not present

## 2021-05-15 DIAGNOSIS — H52201 Unspecified astigmatism, right eye: Secondary | ICD-10-CM | POA: Diagnosis not present

## 2021-05-15 DIAGNOSIS — H25811 Combined forms of age-related cataract, right eye: Secondary | ICD-10-CM | POA: Diagnosis not present

## 2021-05-15 DIAGNOSIS — H2511 Age-related nuclear cataract, right eye: Secondary | ICD-10-CM | POA: Diagnosis not present

## 2021-05-16 DIAGNOSIS — H2512 Age-related nuclear cataract, left eye: Secondary | ICD-10-CM | POA: Diagnosis not present

## 2021-05-29 DIAGNOSIS — H25812 Combined forms of age-related cataract, left eye: Secondary | ICD-10-CM | POA: Diagnosis not present

## 2021-05-29 DIAGNOSIS — H52202 Unspecified astigmatism, left eye: Secondary | ICD-10-CM | POA: Diagnosis not present

## 2021-05-29 DIAGNOSIS — H2512 Age-related nuclear cataract, left eye: Secondary | ICD-10-CM | POA: Diagnosis not present

## 2021-07-19 DIAGNOSIS — E7800 Pure hypercholesterolemia, unspecified: Secondary | ICD-10-CM | POA: Diagnosis not present

## 2021-07-19 DIAGNOSIS — R739 Hyperglycemia, unspecified: Secondary | ICD-10-CM | POA: Diagnosis not present

## 2021-07-19 DIAGNOSIS — R7309 Other abnormal glucose: Secondary | ICD-10-CM | POA: Diagnosis not present

## 2021-07-19 DIAGNOSIS — R7989 Other specified abnormal findings of blood chemistry: Secondary | ICD-10-CM | POA: Diagnosis not present

## 2021-07-19 DIAGNOSIS — E559 Vitamin D deficiency, unspecified: Secondary | ICD-10-CM | POA: Diagnosis not present

## 2021-07-19 DIAGNOSIS — I10 Essential (primary) hypertension: Secondary | ICD-10-CM | POA: Diagnosis not present

## 2021-07-26 DIAGNOSIS — Z0000 Encounter for general adult medical examination without abnormal findings: Secondary | ICD-10-CM | POA: Diagnosis not present

## 2021-07-26 DIAGNOSIS — E7800 Pure hypercholesterolemia, unspecified: Secondary | ICD-10-CM | POA: Diagnosis not present

## 2021-07-26 DIAGNOSIS — M778 Other enthesopathies, not elsewhere classified: Secondary | ICD-10-CM | POA: Diagnosis not present

## 2021-07-26 DIAGNOSIS — M8589 Other specified disorders of bone density and structure, multiple sites: Secondary | ICD-10-CM | POA: Diagnosis not present

## 2021-07-26 DIAGNOSIS — Z9109 Other allergy status, other than to drugs and biological substances: Secondary | ICD-10-CM | POA: Diagnosis not present

## 2021-07-26 DIAGNOSIS — I10 Essential (primary) hypertension: Secondary | ICD-10-CM | POA: Diagnosis not present

## 2021-07-26 DIAGNOSIS — R7309 Other abnormal glucose: Secondary | ICD-10-CM | POA: Diagnosis not present

## 2021-07-26 DIAGNOSIS — M8580 Other specified disorders of bone density and structure, unspecified site: Secondary | ICD-10-CM | POA: Diagnosis not present

## 2021-08-03 DIAGNOSIS — H40013 Open angle with borderline findings, low risk, bilateral: Secondary | ICD-10-CM | POA: Diagnosis not present

## 2021-08-10 DIAGNOSIS — L249 Irritant contact dermatitis, unspecified cause: Secondary | ICD-10-CM | POA: Diagnosis not present

## 2021-10-02 DIAGNOSIS — L249 Irritant contact dermatitis, unspecified cause: Secondary | ICD-10-CM | POA: Diagnosis not present

## 2021-10-02 DIAGNOSIS — L562 Photocontact dermatitis [berloque dermatitis]: Secondary | ICD-10-CM | POA: Diagnosis not present

## 2021-10-10 ENCOUNTER — Other Ambulatory Visit: Payer: Self-pay | Admitting: Family Medicine

## 2021-10-10 ENCOUNTER — Ambulatory Visit
Admission: RE | Admit: 2021-10-10 | Discharge: 2021-10-10 | Disposition: A | Discharge status: Home / Self Care | Payer: Medicare PPO | Source: Ambulatory Visit | Attending: Family Medicine | Admitting: Family Medicine

## 2021-10-10 DIAGNOSIS — R921 Mammographic calcification found on diagnostic imaging of breast: Secondary | ICD-10-CM

## 2022-01-29 DIAGNOSIS — I10 Essential (primary) hypertension: Secondary | ICD-10-CM | POA: Diagnosis not present

## 2022-01-29 DIAGNOSIS — M8580 Other specified disorders of bone density and structure, unspecified site: Secondary | ICD-10-CM | POA: Diagnosis not present

## 2022-01-29 DIAGNOSIS — R7309 Other abnormal glucose: Secondary | ICD-10-CM | POA: Diagnosis not present

## 2022-01-30 DIAGNOSIS — L821 Other seborrheic keratosis: Secondary | ICD-10-CM | POA: Diagnosis not present

## 2022-01-30 DIAGNOSIS — Z08 Encounter for follow-up examination after completed treatment for malignant neoplasm: Secondary | ICD-10-CM | POA: Diagnosis not present

## 2022-01-30 DIAGNOSIS — Z85828 Personal history of other malignant neoplasm of skin: Secondary | ICD-10-CM | POA: Diagnosis not present

## 2022-01-30 DIAGNOSIS — L814 Other melanin hyperpigmentation: Secondary | ICD-10-CM | POA: Diagnosis not present

## 2022-01-30 DIAGNOSIS — D1801 Hemangioma of skin and subcutaneous tissue: Secondary | ICD-10-CM | POA: Diagnosis not present

## 2022-02-03 IMAGING — MG MM DIGITAL SCREENING BILAT W/ TOMO AND CAD
6 of 12 series · 6 of 36 positions shown · non-contrast
Comparison: Prior films

CLINICAL DATA: Screening.

EXAM:
DIGITAL SCREENING BILATERAL MAMMOGRAM WITH TOMO AND CAD

[L MLO synth-2D]
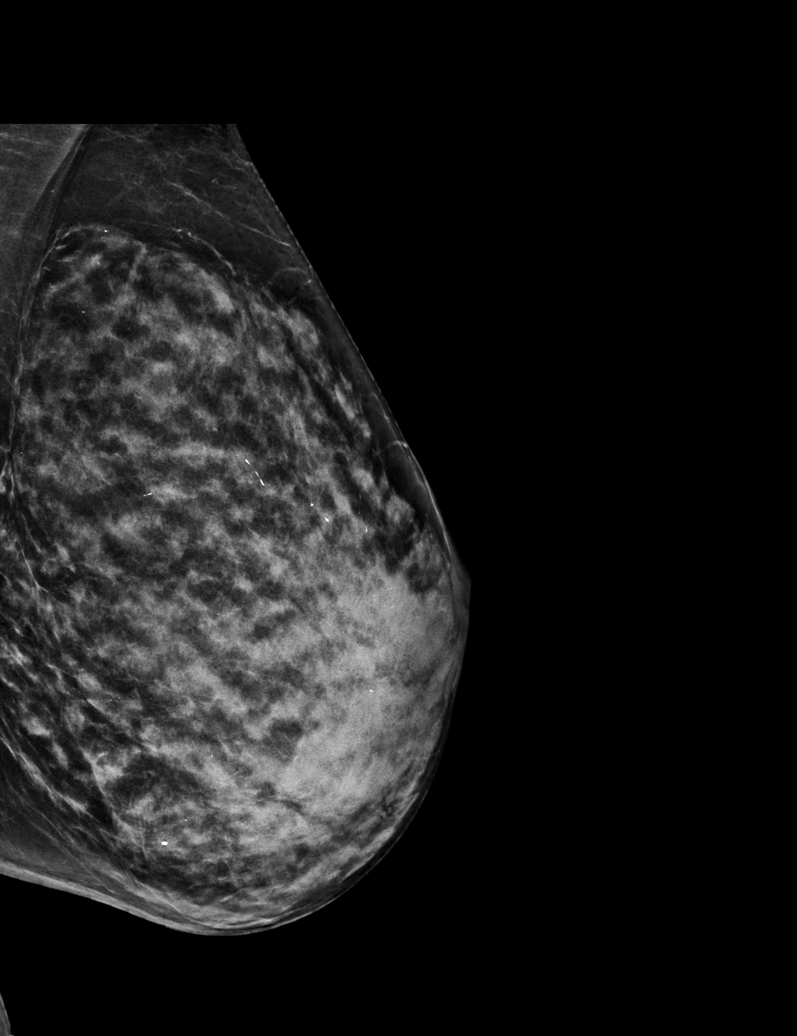

[R MLO synth-2D]
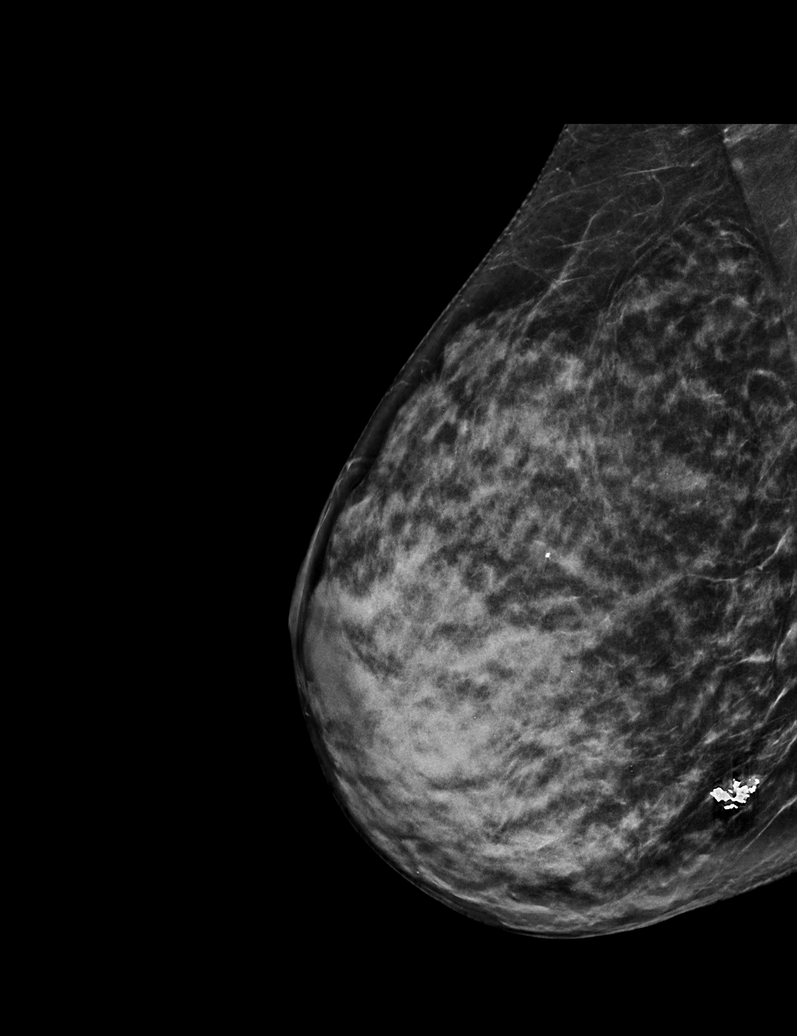

[L CC synth-2D (1 of 2)]
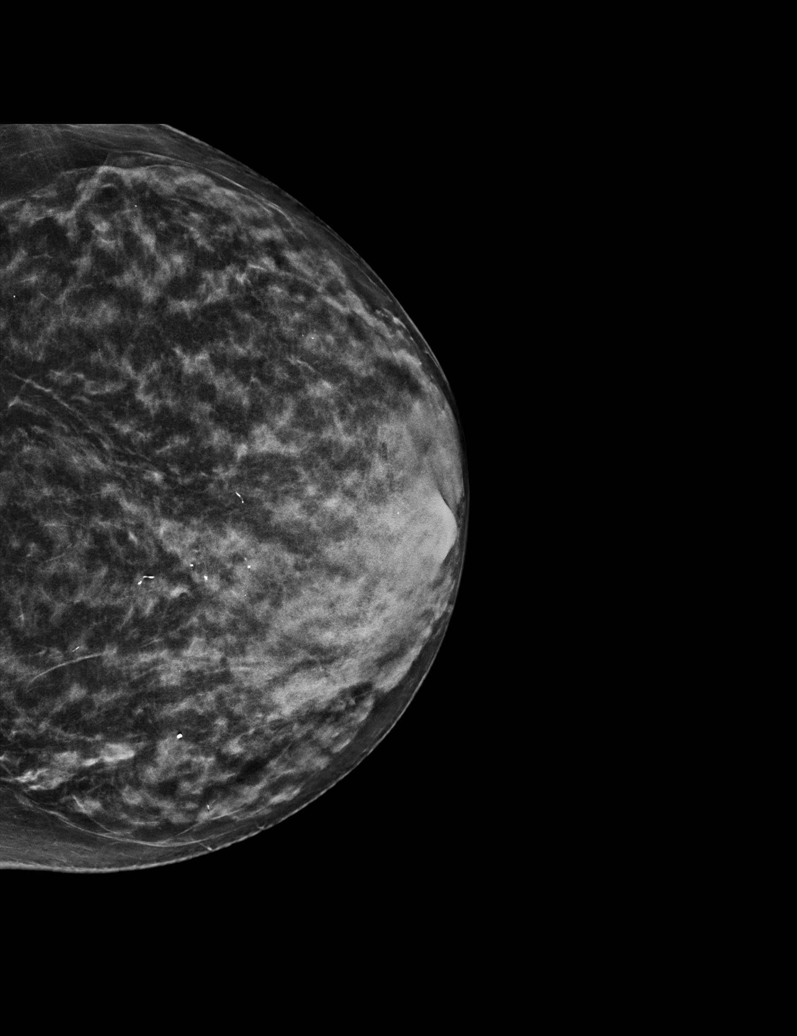

[L CC synth-2D (2 of 2)]
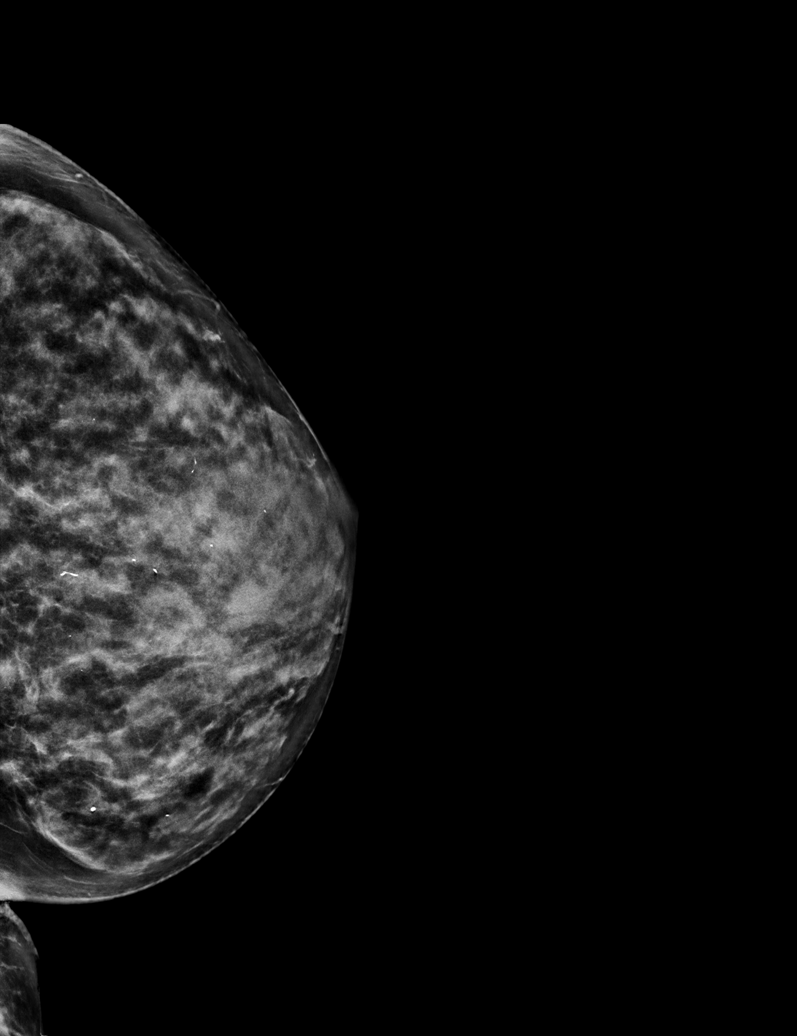

[R CC synth-2D (1 of 2)]
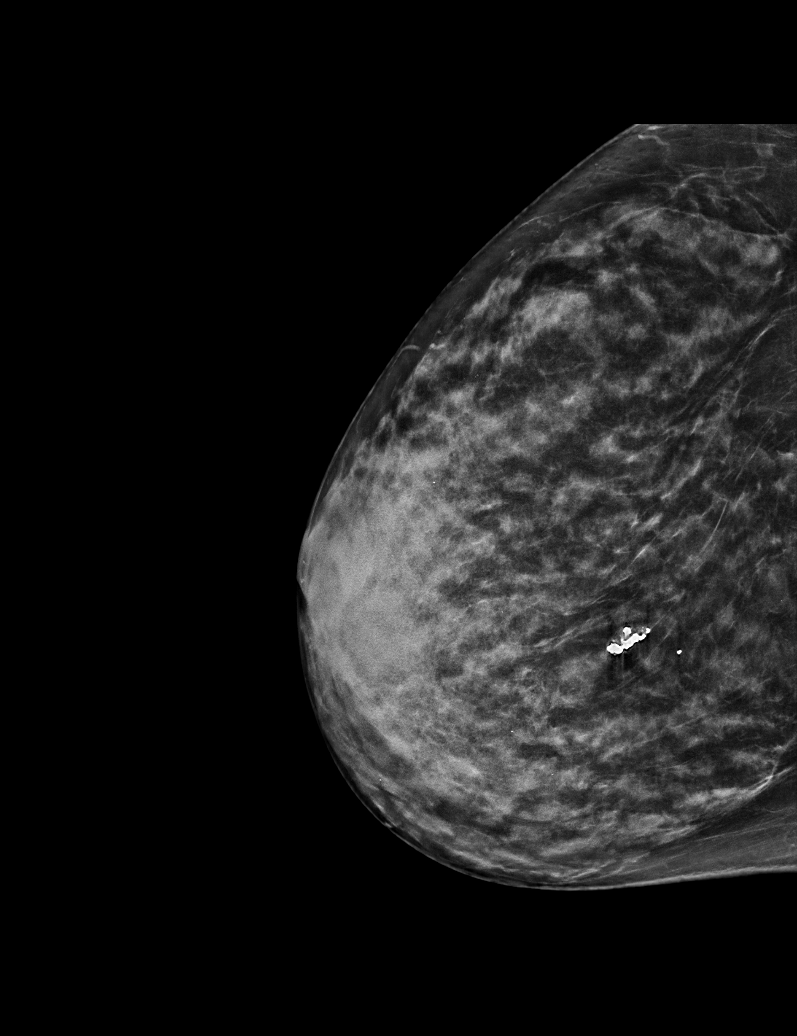

[R CC synth-2D (2 of 2)]
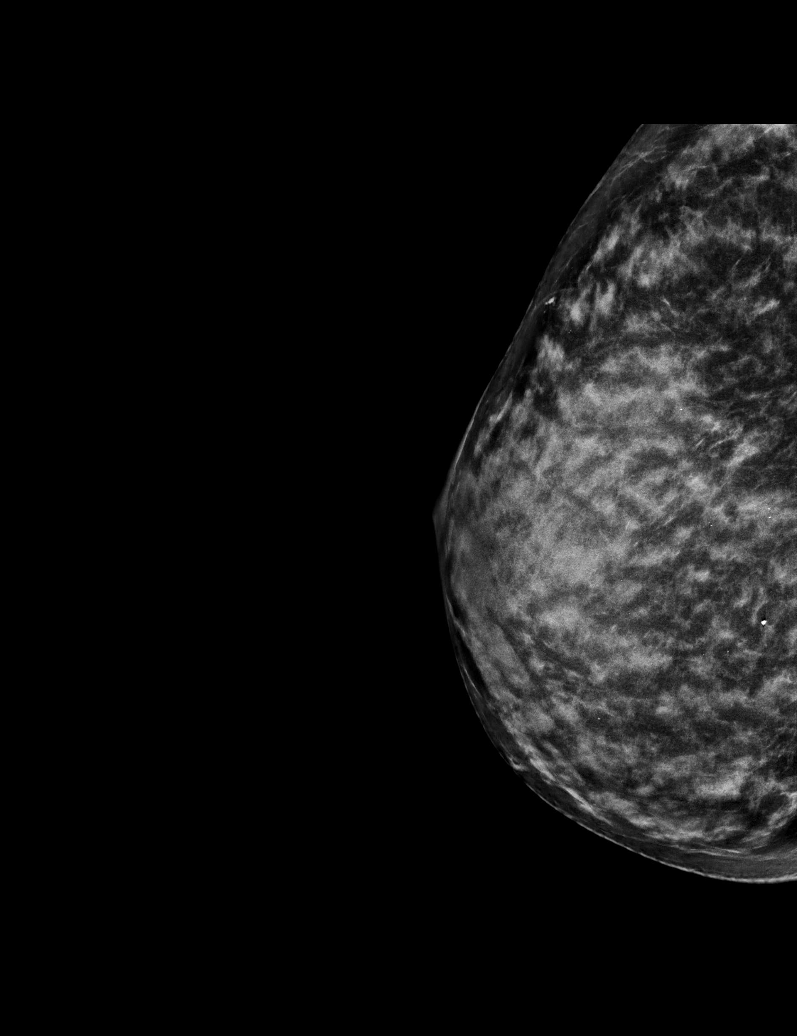

[6 of 36 positions shown; findings below may reference images not displayed]

ACR Breast Density Category d: The breast tissue is extremely dense,
which lowers the sensitivity of mammography.
FINDINGS: In the left breast, calcifications warrant further evaluation. In
the right breast, no findings suspicious for malignancy. The images
were evaluated with computer-aided detection.
IMPRESSION: Further evaluation is suggested for calcifications in the left
breast.

RECOMMENDATION:
Diagnostic mammogram of the left breast. (Code:[id number])

The patient will be contacted regarding the findings, and additional
imaging will be scheduled.

BI-RADS CATEGORY  0: Incomplete. Need additional imaging evaluation
and/or prior mammograms for comparison.

## 2022-02-08 DIAGNOSIS — H40013 Open angle with borderline findings, low risk, bilateral: Secondary | ICD-10-CM | POA: Diagnosis not present

## 2022-02-12 DIAGNOSIS — L259 Unspecified contact dermatitis, unspecified cause: Secondary | ICD-10-CM | POA: Diagnosis not present

## 2022-03-01 DIAGNOSIS — Z79899 Other long term (current) drug therapy: Secondary | ICD-10-CM | POA: Diagnosis not present

## 2022-03-01 DIAGNOSIS — R7989 Other specified abnormal findings of blood chemistry: Secondary | ICD-10-CM | POA: Diagnosis not present

## 2022-03-01 DIAGNOSIS — Z9109 Other allergy status, other than to drugs and biological substances: Secondary | ICD-10-CM | POA: Diagnosis not present

## 2022-03-01 DIAGNOSIS — R7309 Other abnormal glucose: Secondary | ICD-10-CM | POA: Diagnosis not present

## 2022-03-01 DIAGNOSIS — M8580 Other specified disorders of bone density and structure, unspecified site: Secondary | ICD-10-CM | POA: Diagnosis not present

## 2022-03-01 DIAGNOSIS — E559 Vitamin D deficiency, unspecified: Secondary | ICD-10-CM | POA: Diagnosis not present

## 2022-03-01 DIAGNOSIS — I10 Essential (primary) hypertension: Secondary | ICD-10-CM | POA: Diagnosis not present

## 2022-04-09 ENCOUNTER — Ambulatory Visit
Admission: RE | Admit: 2022-04-09 | Discharge: 2022-04-09 | Disposition: A | Discharge status: Home / Self Care | Payer: Medicare PPO | Source: Ambulatory Visit | Attending: Family Medicine | Admitting: Family Medicine

## 2022-04-09 DIAGNOSIS — R921 Mammographic calcification found on diagnostic imaging of breast: Secondary | ICD-10-CM

## 2022-04-24 DIAGNOSIS — J069 Acute upper respiratory infection, unspecified: Secondary | ICD-10-CM | POA: Diagnosis not present

## 2022-08-21 DIAGNOSIS — H40013 Open angle with borderline findings, low risk, bilateral: Secondary | ICD-10-CM | POA: Diagnosis not present

## 2022-09-05 DIAGNOSIS — E559 Vitamin D deficiency, unspecified: Secondary | ICD-10-CM | POA: Diagnosis not present

## 2022-09-05 DIAGNOSIS — Z79899 Other long term (current) drug therapy: Secondary | ICD-10-CM | POA: Diagnosis not present

## 2022-09-05 DIAGNOSIS — R7309 Other abnormal glucose: Secondary | ICD-10-CM | POA: Diagnosis not present

## 2022-09-05 DIAGNOSIS — Z9109 Other allergy status, other than to drugs and biological substances: Secondary | ICD-10-CM | POA: Diagnosis not present

## 2022-09-05 DIAGNOSIS — I10 Essential (primary) hypertension: Secondary | ICD-10-CM | POA: Diagnosis not present

## 2022-09-05 DIAGNOSIS — M8580 Other specified disorders of bone density and structure, unspecified site: Secondary | ICD-10-CM | POA: Diagnosis not present

## 2022-09-05 DIAGNOSIS — R7989 Other specified abnormal findings of blood chemistry: Secondary | ICD-10-CM | POA: Diagnosis not present

## 2022-09-12 DIAGNOSIS — Z79899 Other long term (current) drug therapy: Secondary | ICD-10-CM | POA: Diagnosis not present

## 2022-09-12 DIAGNOSIS — I10 Essential (primary) hypertension: Secondary | ICD-10-CM | POA: Diagnosis not present

## 2022-09-12 DIAGNOSIS — E7800 Pure hypercholesterolemia, unspecified: Secondary | ICD-10-CM | POA: Diagnosis not present

## 2022-09-12 DIAGNOSIS — R7303 Prediabetes: Secondary | ICD-10-CM | POA: Diagnosis not present

## 2022-09-12 DIAGNOSIS — Z9109 Other allergy status, other than to drugs and biological substances: Secondary | ICD-10-CM | POA: Diagnosis not present

## 2022-09-12 DIAGNOSIS — Z0000 Encounter for general adult medical examination without abnormal findings: Secondary | ICD-10-CM | POA: Diagnosis not present

## 2022-10-24 DIAGNOSIS — L820 Inflamed seborrheic keratosis: Secondary | ICD-10-CM | POA: Diagnosis not present

## 2022-10-24 DIAGNOSIS — D485 Neoplasm of uncertain behavior of skin: Secondary | ICD-10-CM | POA: Diagnosis not present

## 2022-10-24 DIAGNOSIS — C44319 Basal cell carcinoma of skin of other parts of face: Secondary | ICD-10-CM | POA: Diagnosis not present

## 2022-10-24 DIAGNOSIS — L298 Other pruritus: Secondary | ICD-10-CM | POA: Diagnosis not present

## 2022-10-24 DIAGNOSIS — R208 Other disturbances of skin sensation: Secondary | ICD-10-CM | POA: Diagnosis not present

## 2022-10-24 DIAGNOSIS — Z789 Other specified health status: Secondary | ICD-10-CM | POA: Diagnosis not present

## 2022-10-24 DIAGNOSIS — L538 Other specified erythematous conditions: Secondary | ICD-10-CM | POA: Diagnosis not present

## 2022-11-21 DIAGNOSIS — C44319 Basal cell carcinoma of skin of other parts of face: Secondary | ICD-10-CM | POA: Diagnosis not present

## 2022-11-28 DIAGNOSIS — L244 Irritant contact dermatitis due to drugs in contact with skin: Secondary | ICD-10-CM | POA: Diagnosis not present

## 2023-02-06 DIAGNOSIS — L538 Other specified erythematous conditions: Secondary | ICD-10-CM | POA: Diagnosis not present

## 2023-02-06 DIAGNOSIS — D225 Melanocytic nevi of trunk: Secondary | ICD-10-CM | POA: Diagnosis not present

## 2023-02-06 DIAGNOSIS — L820 Inflamed seborrheic keratosis: Secondary | ICD-10-CM | POA: Diagnosis not present

## 2023-02-06 DIAGNOSIS — Z789 Other specified health status: Secondary | ICD-10-CM | POA: Diagnosis not present

## 2023-02-06 DIAGNOSIS — Z08 Encounter for follow-up examination after completed treatment for malignant neoplasm: Secondary | ICD-10-CM | POA: Diagnosis not present

## 2023-02-06 DIAGNOSIS — L821 Other seborrheic keratosis: Secondary | ICD-10-CM | POA: Diagnosis not present

## 2023-02-06 DIAGNOSIS — R208 Other disturbances of skin sensation: Secondary | ICD-10-CM | POA: Diagnosis not present

## 2023-02-06 DIAGNOSIS — Z85828 Personal history of other malignant neoplasm of skin: Secondary | ICD-10-CM | POA: Diagnosis not present

## 2023-02-06 DIAGNOSIS — L814 Other melanin hyperpigmentation: Secondary | ICD-10-CM | POA: Diagnosis not present

## 2023-02-22 DIAGNOSIS — H40013 Open angle with borderline findings, low risk, bilateral: Secondary | ICD-10-CM | POA: Diagnosis not present

## 2023-02-25 ENCOUNTER — Other Ambulatory Visit: Payer: Self-pay | Admitting: Family Medicine

## 2023-02-25 DIAGNOSIS — R921 Mammographic calcification found on diagnostic imaging of breast: Secondary | ICD-10-CM

## 2023-03-11 DIAGNOSIS — I10 Essential (primary) hypertension: Secondary | ICD-10-CM | POA: Diagnosis not present

## 2023-03-11 DIAGNOSIS — E7800 Pure hypercholesterolemia, unspecified: Secondary | ICD-10-CM | POA: Diagnosis not present

## 2023-03-11 DIAGNOSIS — Z79899 Other long term (current) drug therapy: Secondary | ICD-10-CM | POA: Diagnosis not present

## 2023-03-11 DIAGNOSIS — R7303 Prediabetes: Secondary | ICD-10-CM | POA: Diagnosis not present

## 2023-03-18 DIAGNOSIS — I10 Essential (primary) hypertension: Secondary | ICD-10-CM | POA: Diagnosis not present

## 2023-03-18 DIAGNOSIS — Z79899 Other long term (current) drug therapy: Secondary | ICD-10-CM | POA: Diagnosis not present

## 2023-03-18 DIAGNOSIS — E7800 Pure hypercholesterolemia, unspecified: Secondary | ICD-10-CM | POA: Diagnosis not present

## 2023-03-18 DIAGNOSIS — E559 Vitamin D deficiency, unspecified: Secondary | ICD-10-CM | POA: Diagnosis not present

## 2023-03-18 DIAGNOSIS — R7989 Other specified abnormal findings of blood chemistry: Secondary | ICD-10-CM | POA: Diagnosis not present

## 2023-03-18 DIAGNOSIS — R7309 Other abnormal glucose: Secondary | ICD-10-CM | POA: Diagnosis not present

## 2023-04-15 ENCOUNTER — Ambulatory Visit
Admission: RE | Admit: 2023-04-15 | Discharge: 2023-04-15 | Disposition: A | Discharge status: Home / Self Care | Payer: Medicare PPO | Source: Ambulatory Visit | Attending: Family Medicine | Admitting: Family Medicine

## 2023-04-15 ENCOUNTER — Ambulatory Visit: Payer: Medicare PPO

## 2023-04-15 DIAGNOSIS — R921 Mammographic calcification found on diagnostic imaging of breast: Secondary | ICD-10-CM | POA: Diagnosis not present

## 2023-04-15 DIAGNOSIS — R92333 Mammographic heterogeneous density, bilateral breasts: Secondary | ICD-10-CM | POA: Diagnosis not present

## 2023-04-17 ENCOUNTER — Other Ambulatory Visit: Payer: Self-pay | Admitting: Medical Genetics

## 2023-04-24 DIAGNOSIS — D485 Neoplasm of uncertain behavior of skin: Secondary | ICD-10-CM | POA: Diagnosis not present

## 2023-04-24 DIAGNOSIS — L308 Other specified dermatitis: Secondary | ICD-10-CM | POA: Diagnosis not present

## 2023-04-29 ENCOUNTER — Other Ambulatory Visit (HOSPITAL_COMMUNITY)
Admission: RE | Admit: 2023-04-29 | Discharge: 2023-04-29 | Disposition: A | Discharge status: Home / Self Care | Payer: Self-pay | Source: Ambulatory Visit | Attending: Medical Genetics | Admitting: Medical Genetics

## 2023-05-07 LAB — GENECONNECT MOLECULAR SCREEN: Genetic Analysis Overall Interpretation: NEGATIVE

## 2023-08-08 DIAGNOSIS — Z08 Encounter for follow-up examination after completed treatment for malignant neoplasm: Secondary | ICD-10-CM | POA: Diagnosis not present

## 2023-08-08 DIAGNOSIS — Z85828 Personal history of other malignant neoplasm of skin: Secondary | ICD-10-CM | POA: Diagnosis not present

## 2023-08-08 DIAGNOSIS — D1801 Hemangioma of skin and subcutaneous tissue: Secondary | ICD-10-CM | POA: Diagnosis not present

## 2023-08-08 DIAGNOSIS — L821 Other seborrheic keratosis: Secondary | ICD-10-CM | POA: Diagnosis not present

## 2023-08-08 DIAGNOSIS — L814 Other melanin hyperpigmentation: Secondary | ICD-10-CM | POA: Diagnosis not present

## 2023-08-08 DIAGNOSIS — L309 Dermatitis, unspecified: Secondary | ICD-10-CM | POA: Diagnosis not present

## 2023-08-23 DIAGNOSIS — H40013 Open angle with borderline findings, low risk, bilateral: Secondary | ICD-10-CM | POA: Diagnosis not present

## 2023-09-09 DIAGNOSIS — E7800 Pure hypercholesterolemia, unspecified: Secondary | ICD-10-CM | POA: Diagnosis not present

## 2023-09-09 DIAGNOSIS — R7309 Other abnormal glucose: Secondary | ICD-10-CM | POA: Diagnosis not present

## 2023-09-09 DIAGNOSIS — R7989 Other specified abnormal findings of blood chemistry: Secondary | ICD-10-CM | POA: Diagnosis not present

## 2023-09-09 DIAGNOSIS — Z79899 Other long term (current) drug therapy: Secondary | ICD-10-CM | POA: Diagnosis not present

## 2023-09-09 DIAGNOSIS — I10 Essential (primary) hypertension: Secondary | ICD-10-CM | POA: Diagnosis not present

## 2023-09-09 DIAGNOSIS — E559 Vitamin D deficiency, unspecified: Secondary | ICD-10-CM | POA: Diagnosis not present

## 2023-09-16 DIAGNOSIS — I10 Essential (primary) hypertension: Secondary | ICD-10-CM | POA: Diagnosis not present

## 2023-09-16 DIAGNOSIS — E7800 Pure hypercholesterolemia, unspecified: Secondary | ICD-10-CM | POA: Diagnosis not present

## 2023-09-16 DIAGNOSIS — R7309 Other abnormal glucose: Secondary | ICD-10-CM | POA: Diagnosis not present

## 2023-09-16 DIAGNOSIS — M8580 Other specified disorders of bone density and structure, unspecified site: Secondary | ICD-10-CM | POA: Diagnosis not present

## 2023-09-16 DIAGNOSIS — S61219A Laceration without foreign body of unspecified finger without damage to nail, initial encounter: Secondary | ICD-10-CM | POA: Diagnosis not present

## 2023-09-16 DIAGNOSIS — Z79899 Other long term (current) drug therapy: Secondary | ICD-10-CM | POA: Diagnosis not present

## 2023-09-16 DIAGNOSIS — Z0000 Encounter for general adult medical examination without abnormal findings: Secondary | ICD-10-CM | POA: Diagnosis not present

## 2023-09-16 DIAGNOSIS — E559 Vitamin D deficiency, unspecified: Secondary | ICD-10-CM | POA: Diagnosis not present

## 2023-09-16 DIAGNOSIS — R42 Dizziness and giddiness: Secondary | ICD-10-CM | POA: Diagnosis not present

## 2023-09-19 DIAGNOSIS — Z0184 Encounter for antibody response examination: Secondary | ICD-10-CM | POA: Diagnosis not present

## 2024-03-03 ENCOUNTER — Other Ambulatory Visit: Payer: Self-pay | Admitting: Family Medicine

## 2024-03-03 DIAGNOSIS — Z1231 Encounter for screening mammogram for malignant neoplasm of breast: Secondary | ICD-10-CM

## 2024-04-15 ENCOUNTER — Ambulatory Visit
